# Patient Record
Sex: Male | Born: 1995 | Race: Black or African American | Hispanic: No | Marital: Single | Smoking: Never smoker
Health system: Southern US, Community
[De-identification: ages and names within clinical notes are randomized; demographics above are authoritative.]

## PROBLEM LIST (undated history)

## (undated) DIAGNOSIS — F32A Depression, unspecified: Secondary | ICD-10-CM

## (undated) DIAGNOSIS — F329 Major depressive disorder, single episode, unspecified: Secondary | ICD-10-CM

## (undated) DIAGNOSIS — F909 Attention-deficit hyperactivity disorder, unspecified type: Secondary | ICD-10-CM

## (undated) DIAGNOSIS — D573 Sickle-cell trait: Secondary | ICD-10-CM

---
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
## 2019-03-07 ENCOUNTER — Emergency Department (HOSPITAL_COMMUNITY)
Admission: EM | Admit: 2019-03-07 | Discharge: 2019-03-07 | Disposition: A | Discharge status: Home / Self Care | Payer: Self-pay | Attending: Emergency Medicine | Admitting: Emergency Medicine

## 2019-03-07 ENCOUNTER — Emergency Department (HOSPITAL_COMMUNITY)
Admission: EM | Admit: 2019-03-07 | Discharge: 2019-03-07 | Discharge status: Absent without leave | Payer: Self-pay | Attending: Emergency Medicine | Admitting: Emergency Medicine

## 2019-03-07 ENCOUNTER — Other Ambulatory Visit: Payer: Self-pay

## 2019-03-07 DIAGNOSIS — K047 Periapical abscess without sinus: Secondary | ICD-10-CM

## 2019-03-07 DIAGNOSIS — F909 Attention-deficit hyperactivity disorder, unspecified type: Secondary | ICD-10-CM | POA: Insufficient documentation

## 2019-03-07 DIAGNOSIS — Z79899 Other long term (current) drug therapy: Secondary | ICD-10-CM | POA: Insufficient documentation

## 2019-03-07 DIAGNOSIS — K029 Dental caries, unspecified: Secondary | ICD-10-CM

## 2019-03-07 MED ORDER — PENICILLIN V POTASSIUM 500 MG PO TABS: 1000.0000 mg | ORAL_TABLET | Freq: Two times a day (BID) | ORAL | 0 refills | Status: DC

## 2019-03-07 NOTE — ED Provider Notes (Addendum)
[facility]   CSN: [id number] Arrival date & time: 03/07/19  1401    History   Chief Complaint Chief Complaint  Patient presents with   Dental Pain    HPI    Mitchell Murphy is a 23 y.o. otherwise healthy male who presents to the ED with complaints of right upper dental pain that began about 3 days ago.  Patient states that there is a tooth that is broken and he believes that it is causing his discomfort.  He describes the pain as 10/10 constant throbbing nonradiating right upper dental pain that worsens with cold air exposure and has been somewhat improved with Tylenol, benzocaine, and peroxide.  He denies any gum swelling or drainage, drooling, trismus, fevers, chills, or any other complaints at this time.  He is a non-smoker.  He does not currently have a dentist.    No past medical history on file.  Patient Active Problem List   Diagnosis Date Noted   BMI 25.0-25.9,adult 05/30/2017    Past Surgical History:  Procedure Laterality Date   NO PAST SURGERIES          Home Medications    Prior to Admission medications   Medication Sig Start Date End Date Taking? Authorizing Provider  amoxicillin (AMOXIL) 875 MG tablet Take 1 tablet (875 mg total) by mouth 2 (two) times daily. 06/02/17   Porfirio OarJeffery, Chelle, PA  azelastine (ASTELIN) 0.1 % nasal spray Place 2 sprays into both nostrils 2 (two) times daily. Use in each nostril as directed 05/30/17   Porfirio OarJeffery, Chelle, PA  Guaifenesin (MUCINEX MAXIMUM STRENGTH) 1200 MG TB12 Take 1 tablet (1,200 mg total) by mouth every 12 (twelve) hours as needed. 05/30/17   Porfirio OarJeffery, Chelle, PA  penicillin v potassium (VEETID) 500 MG tablet Take 2 tablets (1,000 mg total) by mouth 2 (two) times daily. X 7 days 03/07/19   Street, FedoraMercedes, PA-C    Family History Family History  Problem Relation Age of Onset   Hyperlipidemia Father        managed with lifestyle   Cancer Maternal Grandfather     Diabetes Paternal Grandfather     Social History Social History   Tobacco Use   Smoking status: Never Smoker   Smokeless tobacco: Never Used  Substance Use Topics   Alcohol use: No   Drug use: No     Allergies   Patient has no known allergies.   Review of Systems Review of Systems  Constitutional: Negative for chills and fever.  HENT: Positive for dental problem. Negative for drooling, facial swelling and trouble swallowing.   Allergic/Immunologic: Negative for immunocompromised state.     Physical Exam Updated Vital Signs BP 132/78   Pulse 78   Temp 98 F (36.7 C) (Oral)   Resp 18   SpO2 99%   Physical Exam Vitals signs and nursing note reviewed.  Constitutional:      General: He is not in acute distress.    Appearance: Normal appearance. He is well-developed. He is not toxic-appearing.     Comments: Afebrile, nontoxic, NAD  HENT:     Head: Normocephalic and atraumatic.     Nose: Nose normal.     Mouth/Throat:     Mouth: Mucous membranes are moist.     Dentition: Dental tenderness and dental caries present. No dental abscesses.     Pharynx: Oropharynx is clear. Uvula midline. No pharyngeal swelling, oropharyngeal exudate, posterior oropharyngeal erythema or uvula swelling.  Tonsils: No tonsillar exudate or tonsillar abscesses.      Comments: Nose clear.  R upper tooth #2 partially decayed and with caries with mild TTP, with no surrounding gingival swelling and erythema, no definite abscess, no evidence of ludwig's.  Oropharynx clear and moist, without uvular swelling or deviation, no trismus or drooling, no tonsillar swelling or erythema, no exudates.    Eyes:     General:        Right eye: No discharge.        Left eye: No discharge.     Conjunctiva/sclera: Conjunctivae normal.  Neck:     Musculoskeletal: Normal range of motion and neck supple.  Cardiovascular:     Rate and Rhythm: Normal rate.     Pulses: Normal pulses.  Pulmonary:     Effort:  Pulmonary effort is normal. No respiratory distress.  Abdominal:     General: There is no distension.  Musculoskeletal: Normal range of motion.  Skin:    General: Skin is warm and dry.     Findings: No rash.  Neurological:     Mental Status: He is alert and oriented to person, place, and time.     Sensory: Sensation is intact. No sensory deficit.     Motor: Motor function is intact.  Psychiatric:        Mood and Affect: Mood and affect normal.        Behavior: Behavior normal.      ED Treatments / Results  Labs (all labs ordered are listed, but only abnormal results are displayed) Labs Reviewed - No data to display  EKG None  Radiology No results found.  Procedures Procedures (including critical care time)  Medications Ordered in ED Medications - No data to display   Initial Impression / Assessment and Plan / ED Course  I have reviewed the triage vital signs and the nursing notes.  Pertinent labs & imaging results that were available during my care of the patient were reviewed by me and considered in my medical decision making (see chart for details).        23 y.o. male here with Dental pain associated with dental decay and possible dental infection but no definite abscess, with patient afebrile, non toxic appearing and swallowing secretions well, no evidence of ludwig's. I gave patient referral to dentist/resource guide of dentists and stressed the importance of dental follow up for ultimate management of dental pain.  I have also discussed reasons to return immediately to the ER.  Patient expresses understanding and agrees with plan.  I will also give PCN VK and discussed OTC remedies for pain control.    Final Clinical Impressions(s) / ED Diagnoses   Final diagnoses:  Dental decay  Pain due to dental caries  Infected dental caries    ED Discharge Orders          Ordered    penicillin v potassium (VEETID) 500 MG tablet  2 times daily     03/07/19 [address], [place], [place] 03/07/19 1423    Tilden Fossa, MD 03/07/19 1622

## 2019-03-07 NOTE — ED Provider Notes (Addendum)
[facility]   CSN: [id number] Arrival date & time: 03/07/19  1524    History   Chief Complaint No chief complaint on file.   HPI    Mitchell Murphy is a 23 y.o. male who presents to the ED with complaints of right upper dental pain that began about 3 days ago.  Patient states that there is a tooth that is broken and he believes that it is causing his discomfort.  He describes the pain as 10/10 constant throbbing nonradiating right upper dental pain that worsens with cold air exposure and has been somewhat improved with Tylenol, benzocaine, and peroxide.  He denies any gum swelling or drainage, drooling, trismus, fevers, chills, or any other complaints at this time.  He is a non-smoker.  He does not currently have a dentist.  PLEASE NOTE, THIS IS COPIED FROM THE CHART WHERE HE WAS REGISTERED IN ERROR UNDER THE WRONG NAME.   The history is provided by the patient and medical records. No language interpreter was used.    Past Medical History:  Diagnosis Date  . ADHD (attention deficit hyperactivity disorder)   . Depression   . Sickle cell trait     There are no active problems to display for this patient.   No past surgical history on file.      Home Medications    Prior to Admission medications   Medication Sig Start Date End Date Taking? Authorizing Provider  albuterol (PROVENTIL HFA;VENTOLIN HFA) 108 (90 BASE) MCG/ACT inhaler Inhale 2 puffs into the lungs every 6 (six) hours as needed. For shortness of breath or wheezing    [provider]  doxycycline (VIBRAMYCIN) 100 MG capsule Take 1 capsule (100 mg total) by mouth 2 (two) times daily. 05/24/13   Lattie HawBeese, Stephen A, MD  ferrous sulfate 325 (65 FE) MG tablet Take 325 mg by mouth daily.    [provider]  ketoconazole (NIZORAL) 2 % cream Apply topically daily. 05/24/13   Lattie HawBeese, Stephen A, MD    Family History Family History  Problem Relation Age of  Onset  . Sickle cell trait Father     Social History Social History   Tobacco Use  . Smoking status: Never Smoker  . Smokeless tobacco: Never Used  Substance Use Topics  . Alcohol use: No  . Drug use: No     Allergies   Patient has no known allergies.   Review of Systems Review of Systems  Constitutional: Negative for chills and fever.  HENT: Positive for dental problem. Negative for drooling, facial swelling and trouble swallowing.   Allergic/Immunologic: Negative for immunocompromised state.     Physical Exam Updated Vital Signs BP 132/78   Pulse 78   Temp 98 F (36.7 C) (Oral)   Resp 18   SpO2 99% (COPIED FROM CHART WHERE HE WAS REGISTERED IN ERROR)  Physical Exam Vitals signs and nursing note reviewed.  Constitutional:      General: He is not in acute distress.    Appearance: Normal appearance. He is well-developed. He is not toxic-appearing.     Comments: Afebrile, nontoxic, NAD  HENT:     Head: Normocephalic and atraumatic.     Nose: Nose normal.     Mouth/Throat:     Mouth: Mucous membranes are moist.     Dentition: Dental tenderness and dental caries present. No dental abscesses.     Pharynx: Oropharynx is clear. Uvula midline. No pharyngeal swelling, oropharyngeal exudate, posterior  oropharyngeal erythema or uvula swelling.     Tonsils: No tonsillar exudate or tonsillar abscesses.      Comments: Nose clear.  R upper tooth #2 partially decayed and with caries with mild TTP, with no surrounding gingival swelling and erythema, no definite abscess, no evidence of ludwig's.  Oropharynx clear and moist, without uvular swelling or deviation, no trismus or drooling, no tonsillar swelling or erythema, no exudates.  Eyes:     General:        Right eye: No discharge.        Left eye: No discharge.     Conjunctiva/sclera: Conjunctivae normal.  Neck:     Musculoskeletal: Normal range of motion and neck supple.  Cardiovascular:     Rate and Rhythm: Normal  rate.     Pulses: Normal pulses.  Pulmonary:     Effort: Pulmonary effort is normal. No respiratory distress.  Abdominal:     General: There is no distension.  Musculoskeletal: Normal range of motion.  Skin:    General: Skin is warm and dry.     Findings: No rash.  Neurological:     Mental Status: He is alert and oriented to person, place, and time.     Sensory: Sensation is intact. No sensory deficit.     Motor: Motor function is intact.  Psychiatric:        Mood and Affect: Mood and affect normal.        Behavior: Behavior normal.      ED Treatments / Results  Labs (all labs ordered are listed, but only abnormal results are displayed) Labs Reviewed - No data to display  EKG None  Radiology No results found.  Procedures Procedures (including critical care time)  Medications Ordered in ED Medications - No data to display   Initial Impression / Assessment and Plan / ED Course  I have reviewed the triage vital signs and the nursing notes.  Pertinent labs & imaging results that were available during my care of the patient were reviewed by me and considered in my medical decision making (see chart for details).        23 y.o. male here with Dental pain associated with dental decay and possible dental infection but no definite abscess, with patient afebrile, non toxic appearing and swallowing secretions well, no evidence of ludwig's. I gave patient referral to dentist/resource guide of dentists and stressed the importance of dental follow up for ultimate management of dental pain.  I have also discussed reasons to return immediately to the ER.  Patient expresses understanding and agrees with plan.  I will also give PCN VK and discussed OTC remedies for pain control.   PLEASE NOTE, THIS IS COPIED FROM THE CHART WHERE HE WAS REGISTERED IN ERROR UNDER THE WRONG NAME.    Final Clinical Impressions(s) / ED Diagnoses   Final diagnoses:  Dental decay  Pain due to dental  caries  Infected dental caries    ED Discharge Orders         Ordered    penicillin v potassium (VEETID) 500 MG tablet  2 times daily     03/07/19 [address] [place], [place] 03/07/19 1557    Tilden Fossa, MD 03/07/19 [phone]

## 2019-03-07 NOTE — ED Notes (Addendum)
PT RETURNED AND STATES HE WAS REGISTERED UNDER THE WRONG NAME.  BIRTHDATES MATCHED BUT NAME WAS COMPLETELY DIFFERENT.  REGISTRATION NOTIFIED AND PA NOTIFIED.  BELOW IS DOCUMENTATION FROM PREVIOUS VISIT AS THAT RN IS NO LONGER HERE TO COMPLETE UNDER CORRECT CHART.      14:14 HEENT Assessment SANCHEZ, ALEXA AS     Details:  HEENT - HEENT (WDL): Exceptions to WDL  Teeth:  (right upper tooth pain ; no drainage noted)     14:14 Hourly Rounding SANCHEZ, ALEXA AS     Details:  Hourly Rounding - Assessment: Alert; Patient comfortable  Intervention: Call light w/in reach; Environment secured; Introduced self to pt/other; Pain assessed; Patient repositioned; Stretcher locked in lowest position; Plan of care discussed with pt/other      14:14 Triage Completed SANCHEZ, ALEXA AS          14:13 Allergies Reviewed - Review Complete SANCHEZ, ALEXA AS     Details:      14:13 Fall Risk Assessment SANCHEZ, ALEXA AS     Details:  Fall Risk Assessment Tool: - History of fall in last 3 months:: No  Confusion or Disorientation:: No  Intoxicated or Sedated:: No  Impaired Gait:: No  Mobility Assist Device Used:: No  Altered Elimination: No  Fall Risk Score:: 0  Adult Fall Risk - Patient Fall Risk Level: Low fall risk  Adult Fall Risk Interventions by Location - Fall risk intervention based on location: ED Patient  Adult Fall Risk Interventions by Location - Required Bundle Interventions *See Row Information*: Low risk bundle implemented      14:13 Violence Risk Assess/Score SANCHEZ, ALEXA AS     Details:  Presenting Behavior - History of Violence: No  Uncooperative: No  Verbal Abuse: No  Hostile/Attacking Objects: No  Threats: No  Assaultive/Combative: No  Total Violence/Safety Score: 0  Violence Risk - Violence Risk: Low/Universal      14:13 Weapons Assessment SANCHEZ, ALEXA AS     Details:  Weapons Assessment - Weapons assessment this visit:: None      14:13 Attempt Initial Suicide Assessment SANCHEZ,  ALEXA AS     Details:  Risk for Self-Harm - Is the patient at risk for self-harm, presenting with behavioral health concerns, or has history of behavioral services?: No      14:13 Other Flowsheet Documentation SANCHEZ, ALEXA AS     Details:  Other flowsheet entries - Patient Acuity: 4  Triage Complete: Triage complete      14:12 Travel Screening SANCHEZ, ALEXA AS     Details:  In the last month, have you been in contact with someone who was confirmed or suspected to have Coronavirus / COVID-19? No / Unsure Do you have any of the following symptoms? None of these Have you traveled internationally in the last month? No     14:12 Primary Assessment SANCHEZ, ALEXA AS     Details:  Airway - Airway (WDL): Within Defined Limits  Breathing - Breathing (WDL): Within Defined Limits  Circulation - Circulation (WDL): Within Defined Limits  Disability - Disability (WDL): Within Defined Limits      14:12 Pain Assessment SANCHEZ, ALEXA AS     Details:  Pain Assessment - Pain Scale: 0-10  Pain Score: 7   Pain Type: Acute pain  Pain Location: Teeth  Pain Orientation: Right  Pain Descriptors / Indicators: Aching      14:12 Vitals SANCHEZ, ALEXA AS     Details:  Vital Signs - Temp: 98 F (36.7 C)  Temp Source: Oral  Pulse Rate: 78  Resp: 18  BP: 132/78  Oxygen Therapy - SpO2: 99 %  O2 Device: Room Air  Vitals Assessment - Automatic Restart Vitals Timer: Yes      14:11 ED Triage Notes Filed SANCHEZ, ALEXA AS     Details:  Pt c/o right upper tooth pain ; denies any drainage or trouble breathing ; denies any fever      14:11 Chief Complaints Updated SANCHEZ, ALEXA AS     Details:  + Dental Pain      14:11 Triage Started SANCHEZ, ALEXA AS          14:10 Other Flowsheet Documentation SANCHEZ, ALEXA AS     Details:  Other flowsheet entries - Means of Arrival: POV  Triage Start: Start  Did the patient arrive from a Nursing Home or Assisted Living Facility?: No      14:06 Vitals Reginold Agent  MW     Details:  Vital Signs - BP: 132/78      14:06 Other Flowsheet Documentation Reginold Agent MW     Details:  Other flowsheet entries - Means of Arrival: POV      14:03 Assign Mid-level STREET, [place]     Details: Street, [place], PA-C assigned as Physician Assistant     14:03 Assign Attending STREET, [place]     Details: Tilden Fossa, MD assigned as Attending     14:03 Patient roomed in ED Thomasene Ripple KN     Details: To room 008C     14:02 Arrival Complaint       Details:  toothache      14:02 Travel Screening QUICK, KIMBERLY A KQ     Details:  Have you traveled internationally in the last month? No

## 2019-03-07 NOTE — Discharge Instructions (Signed)
Apply warm or cool compresses to jaw throughout the day. Take antibiotic until finished. Alternate between tylenol and motrin as needed for pain. Perform salt water swishes to help with pain/swelling. Use over the counter oragel/benzocaine as needed for additional relief. Followup with a dentist is very important for ongoing evaluation and management of recurrent dental pain, call the dentist listed above in the next 24-48 hours to schedule ongoing dental care, or use the list below to find a dentist in the next 24-48 hours for ongoing management of your dental issue. Return to emergency department for emergent changing or worsening symptoms.  °

## 2019-03-07 NOTE — ED Notes (Addendum)
Pt complaint of right upper tooth pain, denies any drainage or fever. Pt was recently seen here for complaint. Arrived again due to being registered under the wrong name previously.

## 2019-03-07 NOTE — Discharge Instructions (Addendum)
Apply warm or cool compresses to jaw throughout the day. Take antibiotic until finished. Alternate between tylenol and motrin as needed for pain. Perform salt water swishes to help with pain/swelling. Use over the counter oragel/benzocaine as needed for additional relief. Followup with a dentist is very important for ongoing evaluation and management of recurrent dental pain, call the dentist listed above in the next 24-48 hours to schedule ongoing dental care, or use the list below to find a dentist in the next 24-48 hours for ongoing management of your dental issue. Return to emergency department for emergent changing or worsening symptoms.

## 2019-03-07 NOTE — ED Triage Notes (Signed)
Pt c/o right upper tooth pain ; denies any drainage or trouble breathing ; denies any fever

## 2019-08-23 ENCOUNTER — Encounter (HOSPITAL_COMMUNITY): Payer: Self-pay | Admitting: Emergency Medicine

## 2019-08-23 ENCOUNTER — Emergency Department (HOSPITAL_COMMUNITY)
Admission: EM | Admit: 2019-08-23 | Discharge: 2019-08-24 | Discharge status: Against Medical Advice | Payer: Self-pay | Attending: Emergency Medicine | Admitting: Emergency Medicine

## 2019-08-23 ENCOUNTER — Other Ambulatory Visit: Payer: Self-pay

## 2019-08-23 DIAGNOSIS — Z5321 Procedure and treatment not carried out due to patient leaving prior to being seen by health care provider: Secondary | ICD-10-CM | POA: Insufficient documentation

## 2019-08-23 LAB — COMPREHENSIVE METABOLIC PANEL
ALT: 29 U/L (ref 0–44)
AST: 32 U/L (ref 15–41)
Albumin: 4.7 g/dL (ref 3.5–5.0)
Alkaline Phosphatase: 58 U/L (ref 38–126)
Anion gap: 12 (ref 5–15)
BUN: 16 mg/dL (ref 6–20)
CO2: 21 mmol/L — ABNORMAL LOW (ref 22–32)
Calcium: 9.8 mg/dL (ref 8.9–10.3)
Chloride: 103 mmol/L (ref 98–111)
Creatinine, Ser: 1.23 mg/dL (ref 0.61–1.24)
GFR calc Af Amer: >60 mL/min (ref >60)
GFR calc non Af Amer: >60 mL/min (ref >60)
Glucose, Bld: 98 mg/dL (ref 70–99)
Potassium: 4.4 mmol/L (ref 3.5–5.1)
Sodium: 136 mmol/L (ref 135–145)
Total Bilirubin: 0.6 mg/dL (ref 0.3–1.2)
Total Protein: 7.7 g/dL (ref 6.5–8.1)

## 2019-08-23 LAB — CBC
HCT: 46.6 % (ref 39.0–52.0)
Hemoglobin: 15.4 g/dL (ref 13.0–17.0)
MCH: 26.8 pg (ref 26.0–34.0)
MCHC: 33 g/dL (ref 30.0–36.0)
MCV: 81 fL (ref 80.0–100.0)
Platelets: 284 10*3/uL (ref 150–400)
RBC: 5.75 MIL/uL (ref 4.22–5.81)
RDW: 13.2 % (ref 11.5–15.5)
WBC: 8.7 10*3/uL (ref 4.0–10.5)
nRBC: 0 % (ref 0.0–0.2)

## 2019-08-23 LAB — ETHANOL: Alcohol, Ethyl (B): <10 mg/dL (ref <10)

## 2019-08-23 LAB — RAPID URINE DRUG SCREEN, HOSP PERFORMED
Amphetamines: NOT DETECTED
Barbiturates: NOT DETECTED
Benzodiazepines: NOT DETECTED
Cocaine: NOT DETECTED
Opiates: NOT DETECTED
Tetrahydrocannabinol: NOT DETECTED

## 2019-08-23 LAB — ACETAMINOPHEN LEVEL: Acetaminophen (Tylenol), Serum: <10 ug/mL — ABNORMAL LOW (ref 10–30)

## 2019-08-23 LAB — SALICYLATE LEVEL: Salicylate Lvl: <7 mg/dL (ref 2.8–30.0)

## 2019-08-23 NOTE — ED Triage Notes (Signed)
Patient here from home with complaints of anxiety and "feeling overwhelmed". Reports that he has been putting off seeking help for over 3 months.

## 2019-09-20 ENCOUNTER — Encounter (HOSPITAL_BASED_OUTPATIENT_CLINIC_OR_DEPARTMENT_OTHER): Payer: Self-pay | Admitting: Emergency Medicine

## 2019-09-20 ENCOUNTER — Other Ambulatory Visit: Payer: Self-pay

## 2019-09-20 ENCOUNTER — Emergency Department (HOSPITAL_BASED_OUTPATIENT_CLINIC_OR_DEPARTMENT_OTHER)
Admission: EM | Admit: 2019-09-20 | Discharge: 2019-09-21 | Disposition: A | Discharge status: Home / Self Care | Payer: Self-pay | Attending: Emergency Medicine | Admitting: Emergency Medicine

## 2019-09-20 DIAGNOSIS — R509 Fever, unspecified: Secondary | ICD-10-CM | POA: Insufficient documentation

## 2019-09-20 DIAGNOSIS — Z79899 Other long term (current) drug therapy: Secondary | ICD-10-CM | POA: Insufficient documentation

## 2019-09-20 DIAGNOSIS — Z20828 Contact with and (suspected) exposure to other viral communicable diseases: Secondary | ICD-10-CM | POA: Insufficient documentation

## 2019-09-20 NOTE — ED Triage Notes (Signed)
Fever today at 1700. Temp 101 at home took tylenol at 2100 tonight. No cough, or sob. C/O sore throat and headache.

## 2019-09-21 LAB — INFLUENZA PANEL BY PCR (TYPE A & B)
Influenza A By PCR: NEGATIVE
Influenza B By PCR: NEGATIVE

## 2019-09-21 LAB — GROUP A STREP BY PCR: Group A Strep by PCR: NOT DETECTED

## 2019-09-21 LAB — SARS CORONAVIRUS 2 (TAT 6-24 HRS): SARS Coronavirus 2: NEGATIVE

## 2019-09-21 MED ORDER — ONDANSETRON 4 MG PO TBDP: ORAL_TABLET | ORAL | 0 refills | Status: DC

## 2019-09-21 NOTE — ED Provider Notes (Signed)
[facility] EMERGENCY DEPARTMENT Provider Note   CSN: [id number] Arrival date & time: 09/20/19  2332     History   Chief Complaint Chief Complaint  Patient presents with  . Fever    HPI Mitchell Murphy is a 23 y.o. male.      Fever Max temp prior to arrival:  101 Temp source:  Oral Severity:  Moderate Onset quality:  Gradual Duration:  2 days Timing:  Constant Progression:  Worsening Chronicity:  New Relieved by:  Acetaminophen Ineffective treatments:  None tried Associated symptoms: congestion, headaches, myalgias, nausea, rhinorrhea and sore throat   Associated symptoms: no chest pain and no cough     Past Medical History:  Diagnosis Date  . ADHD (attention deficit hyperactivity disorder)   . Depression   . Sickle cell trait ([place])     There are no active problems to display for this patient.   History reviewed. No pertinent surgical history.      Home Medications    Prior to Admission medications   Medication Sig Start Date End Date Taking? Authorizing Provider  albuterol (PROVENTIL HFA;VENTOLIN HFA) 108 (90 BASE) MCG/ACT inhaler Inhale 2 puffs into the lungs every 6 (six) hours as needed. For shortness of breath or wheezing    [provider]  doxycycline (VIBRAMYCIN) 100 MG capsule Take 1 capsule (100 mg total) by mouth 2 (two) times daily. 05/24/13   Kandra Nicolas, MD  ferrous sulfate 325 (65 FE) MG tablet Take 325 mg by mouth daily.    [provider]  ketoconazole (NIZORAL) 2 % cream Apply topically daily. 05/24/13   Kandra Nicolas, MD  ondansetron (ZOFRAN ODT) 4 MG disintegrating tablet 4mg  ODT q4 hours prn nausea/vomit 09/21/19   Nadelyn Enriques, Corene Cornea, MD  penicillin v potassium (VEETID) 500 MG tablet Take 2 tablets (1,000 mg total) by mouth 2 (two) times daily. X 7 days 03/07/19   Street, [place], PA-C    Family History Family History  Problem Relation Age of Onset  . Sickle cell trait Father     Social History  Social History   Tobacco Use  . Smoking status: Never Smoker  . Smokeless tobacco: Never Used  Substance Use Topics  . Alcohol use: No  . Drug use: No     Allergies   Patient has no known allergies.   Review of Systems Review of Systems  Constitutional: Positive for fever.  HENT: Positive for congestion, rhinorrhea and sore throat.   Respiratory: Negative for cough.   Cardiovascular: Negative for chest pain.  Gastrointestinal: Positive for nausea.  Musculoskeletal: Positive for myalgias.  Neurological: Positive for headaches.  All other systems reviewed and are negative.    Physical Exam Updated Vital Signs BP 119/78 (BP Location: Right Arm)   Pulse 87   Temp 99.5 F (37.5 C) (Oral)   Resp 20   Ht 5\' 11"  (1.803 m)   Wt 102.1 kg   SpO2 100%   BMI 31.38 kg/m   Physical Exam Vitals signs and nursing note reviewed.  Constitutional:      Appearance: He is well-developed.  HENT:     Head: Normocephalic and atraumatic.     Nose: No congestion or rhinorrhea.     Mouth/Throat:     Mouth: Mucous membranes are moist.  Eyes:     Pupils: Pupils are equal, round, and reactive to light.  Neck:     Musculoskeletal: Normal range of motion.  Cardiovascular:     Rate and Rhythm:  Normal rate.  Pulmonary:     Effort: Pulmonary effort is normal. No respiratory distress.  Abdominal:     General: There is no distension.  Musculoskeletal: Normal range of motion.  Skin:    General: Skin is warm and dry.  Neurological:     General: No focal deficit present.     Mental Status: He is alert.      ED Treatments / Results  Labs (all labs ordered are listed, but only abnormal results are displayed) Labs Reviewed  GROUP A STREP BY PCR  SARS CORONAVIRUS 2 (TAT 6-24 HRS)  INFLUENZA PANEL BY PCR (TYPE A & B)    EKG None  Radiology No results found.  Procedures Procedures (including critical care time)  Medications Ordered in ED Medications - No data to display    Initial Impression / Assessment and Plan / ED Course  I have reviewed the triage vital signs and the nursing notes.  Pertinent labs & imaging results that were available during my care of the patient were reviewed by me and considered in my medical decision making (see chart for details).  Considered meningitis however patient does not have nuchal rigidity, high fever, vision changes, altered mental status or other neurologic changes to support this.  Seems to be more likely Covid.  Also considered influenza and will check for that as well.  Patient without any respiratory distress and relatively normal vital signs status post Tylenol.  Plan for supportive care at home.  Mitchell Murphy was evaluated in Emergency Department on 09/21/2019 for the symptoms described in the history of present illness. He was evaluated in the context of the global COVID-19 pandemic, which necessitated consideration that the patient might be at risk for infection with the SARS-CoV-2 virus that causes COVID-19. Institutional protocols and algorithms that pertain to the evaluation of patients at risk for COVID-19 are in a state of rapid change based on information released by regulatory bodies including the CDC and federal and state organizations. These policies and algorithms were followed during the patient's care in the ED.   Final Clinical Impressions(s) / ED Diagnoses   Final diagnoses:  Febrile illness    ED Discharge Orders         Ordered    ondansetron (ZOFRAN ODT) 4 MG disintegrating tablet     09/21/19 0055           Ruthanna Macchia, Barbara Cower, MD 09/21/19 [id number]

## 2020-07-07 ENCOUNTER — Other Ambulatory Visit: Payer: Self-pay

## 2020-07-07 ENCOUNTER — Emergency Department (HOSPITAL_BASED_OUTPATIENT_CLINIC_OR_DEPARTMENT_OTHER): Admission: EM | Admit: 2020-07-07 | Discharge: 2020-07-07 | Discharge status: Against Medical Advice | Payer: Self-pay

## 2020-11-20 ENCOUNTER — Encounter (HOSPITAL_BASED_OUTPATIENT_CLINIC_OR_DEPARTMENT_OTHER): Payer: Self-pay | Admitting: *Deleted

## 2020-11-20 ENCOUNTER — Other Ambulatory Visit: Payer: Self-pay

## 2020-11-20 ENCOUNTER — Emergency Department (HOSPITAL_BASED_OUTPATIENT_CLINIC_OR_DEPARTMENT_OTHER): Payer: Self-pay

## 2020-11-20 ENCOUNTER — Emergency Department (HOSPITAL_BASED_OUTPATIENT_CLINIC_OR_DEPARTMENT_OTHER)
Admission: EM | Admit: 2020-11-20 | Discharge: 2020-11-20 | Disposition: A | Discharge status: Home / Self Care | Payer: Self-pay | Attending: Emergency Medicine | Admitting: Emergency Medicine

## 2020-11-20 DIAGNOSIS — S46912A Strain of unspecified muscle, fascia and tendon at shoulder and upper arm level, left arm, initial encounter: Secondary | ICD-10-CM | POA: Insufficient documentation

## 2020-11-20 DIAGNOSIS — Y9351 Activity, roller skating (inline) and skateboarding: Secondary | ICD-10-CM | POA: Insufficient documentation

## 2020-11-20 MED ORDER — DICLOFENAC SODIUM 50 MG PO TBEC: 50.0000 mg | DELAYED_RELEASE_TABLET | Freq: Two times a day (BID) | ORAL | 0 refills | Status: AC

## 2020-11-20 MED ORDER — ORPHENADRINE CITRATE ER 100 MG PO TB12: 100.0000 mg | ORAL_TABLET | Freq: Two times a day (BID) | ORAL | 0 refills | Status: DC

## 2020-11-20 NOTE — ED Provider Notes (Signed)
[facility] EMERGENCY DEPARTMENT Provider Note   CSN: [id number] Arrival date & time: 11/20/20  1202     History Chief Complaint  Patient presents with  . Fall  . Shoulder Injury    Mitchell Murphy is a 24 y.o. male.  24 year old male presents with complaint of left shoulder injury.  Patient states that he was riding a skateboard yesterday when he fell off her skateboard landing on his left shoulder and hit the left side of his head on the pavement.  Patient did not lose consciousness, is not anticoagulated, denies complaints of headache today.  Patient's primary complaint is left shoulder pain through his AC area and worse with movement of the left shoulder.  Patient has been taking Aleve for his pain with some relief.  No other injuries, complaints, concerns.        Past Medical History:  Diagnosis Date  . ADHD (attention deficit hyperactivity disorder)   . Depression   . Sickle cell trait ([facility])     There are no problems to display for this patient.   History reviewed. No pertinent surgical history.     Family History  Problem Relation Age of Onset  . Sickle cell trait Father     Social History   Tobacco Use  . Smoking status: Never Smoker  . Smokeless tobacco: Never Used  Substance Use Topics  . Alcohol use: No  . Drug use: No    Home Medications Prior to Admission medications   Medication Sig Start Date End Date Taking? Authorizing Provider  diclofenac (VOLTAREN) 50 MG EC tablet Take 1 tablet (50 mg total) by mouth 2 (two) times daily for 10 days. 11/20/20 11/30/20 Yes Jeannie Fend, PA-C  orphenadrine (NORFLEX) 100 MG tablet Take 1 tablet (100 mg total) by mouth 2 (two) times daily. 11/20/20  Yes Jeannie Fend, PA-C  albuterol (PROVENTIL HFA;VENTOLIN HFA) 108 (90 BASE) MCG/ACT inhaler Inhale 2 puffs into the lungs every 6 (six) hours as needed. For shortness of breath or wheezing    [provider]  doxycycline (VIBRAMYCIN) 100 MG  capsule Take 1 capsule (100 mg total) by mouth 2 (two) times daily. 05/24/13   Lattie Haw, MD  ferrous sulfate 325 (65 FE) MG tablet Take 325 mg by mouth daily.    [provider]  ketoconazole (NIZORAL) 2 % cream Apply topically daily. 05/24/13   Lattie Haw, MD  ondansetron (ZOFRAN ODT) 4 MG disintegrating tablet 4mg  ODT q4 hours prn nausea/vomit 09/21/19   Mesner, 09/23/19, MD  penicillin v potassium (VEETID) 500 MG tablet Take 2 tablets (1,000 mg total) by mouth 2 (two) times daily. X 7 days 03/07/19   Street, [place], [place]    Allergies    Patient has no known allergies.  Review of Systems   Review of Systems  Constitutional: Negative for fever.  Musculoskeletal: Positive for arthralgias and myalgias. Negative for back pain, gait problem and neck pain.  Skin: Negative for rash and wound.  Allergic/Immunologic: Negative for immunocompromised state.  Neurological: Negative for dizziness, weakness, numbness and headaches.  Hematological: Does not bruise/bleed easily.  Psychiatric/Behavioral: Negative for confusion.    Physical Exam Updated Vital Signs BP 117/70 (BP Location: Right Arm)   Pulse 62   Temp 97.9 F (36.6 C) (Oral)   Resp 16   Ht 5\' 11"  (1.803 m)   Wt 118.2 kg   SpO2 100%   BMI 36.34 kg/m   Physical Exam Vitals and nursing note reviewed.  Constitutional:      General: He is not in acute distress.    Appearance: He is well-developed and well-nourished. He is not diaphoretic.  HENT:     Head: Normocephalic and atraumatic.  Cardiovascular:     Pulses: Normal pulses.  Pulmonary:     Effort: Pulmonary effort is normal.  Musculoskeletal:        General: Tenderness present. No swelling or deformity.       Arms:     Cervical back: Normal range of motion and neck supple. No tenderness or bony tenderness.       Back:     Comments: TTP left trapezius and left AC, worse with forward extension of the left arm/shoulder. No elbow or wrist pain.    Skin:    General: Skin is warm and dry.     Findings: No erythema or rash.  Neurological:     Mental Status: He is alert and oriented to person, place, and time.  Psychiatric:        Mood and Affect: Mood and affect normal.        Behavior: Behavior normal.     ED Results / Procedures / Treatments   Labs (all labs ordered are listed, but only abnormal results are displayed) Labs Reviewed - No data to display  EKG None  Radiology DG Shoulder Left  Result Date: 11/20/2020 CLINICAL DATA:  Fall.  Rule out fracture. EXAM: LEFT SHOULDER - 2+ VIEW COMPARISON:  None. FINDINGS: Osseous alignment is normal. No fracture line or displaced fracture fragment is seen. Soft tissues about the LEFT shoulder are unremarkable. IMPRESSION: Negative. Electronically Signed   By: Bary Richard M.D.   On: 11/20/2020 13:28    Procedures Procedures (including critical care time)  Medications Ordered in ED Medications - No data to display  ED Course  I have reviewed the triage vital signs and the nursing notes.  Pertinent labs & imaging results that were available during my care of the patient were reviewed by me and considered in my medical decision making (see chart for details).  Clinical Course as of 11/20/20 1942  Mon Nov 20, 2020  5078 24 year old male with complaint of left shoulder pain after fall from skateboard yesterday.  Has tenderness in left trapezius and left AC joint space, worse with forward extension of the left arm.  Skin is intact, no deformity, no swelling.  X-ray left shoulder is unremarkable. Recommend gentle range of motion exercises, warm compresses return instead time, given prescription for diclofenac and Norflex.  Advised to follow-up with sports medicine for further evaluation. [LM]    Clinical Course User Index [LM] Mitchell Murphy   MDM Rules/Calculators/A&P                          Final Clinical Impression(s) / ED Diagnoses Final diagnoses:  Strain of  left shoulder, initial encounter    Rx / DC Orders ED Discharge Orders         Ordered    diclofenac (VOLTAREN) 50 MG EC tablet  2 times daily        11/20/20 1830    orphenadrine (NORFLEX) 100 MG tablet  2 times daily        11/20/20 1830           Mitchell Murphy 11/20/20 1942    Terald Sleeper, MD 11/21/20 [phone]

## 2020-11-20 NOTE — ED Triage Notes (Signed)
He fell off a hover board yesterday. Injury to his left shoulder.

## 2020-11-20 NOTE — Discharge Instructions (Signed)
Follow-up with sports medicine, call tomorrow to schedule an appointment. Apply warm compresses for 20 to time followed by gentle stretching and range of motion exercises as discussed. Take diclofenac as needed as prescribed for pain.  This is an NSAID medication, do not take other NSAIDs such as Aleve, ibuprofen, Motrin or Advil. Take Norflex as needed as prescribed for pain not controlled with diclofenac, do not drive or operate machinery if taking Norflex.

## 2020-11-28 ENCOUNTER — Telehealth: Payer: Self-pay | Admitting: Family Medicine

## 2020-11-28 NOTE — Telephone Encounter (Signed)
Unsuccessful attempt to contact pt for ED follow-up appt--VMB full unable to leave message--cb later--glh

## 2021-06-18 ENCOUNTER — Other Ambulatory Visit: Payer: Self-pay

## 2021-06-18 ENCOUNTER — Ambulatory Visit
Admission: EM | Admit: 2021-06-18 | Discharge: 2021-06-18 | Disposition: A | Discharge status: Home / Self Care | Payer: Medicaid Other | Attending: Family Medicine | Admitting: Family Medicine

## 2021-06-18 DIAGNOSIS — R42 Dizziness and giddiness: Secondary | ICD-10-CM

## 2021-06-18 LAB — POCT URINALYSIS DIP (MANUAL ENTRY)
Bilirubin, UA: NEGATIVE
Blood, UA: NEGATIVE
Glucose, UA: NEGATIVE mg/dL
Ketones, POC UA: NEGATIVE mg/dL
Leukocytes, UA: NEGATIVE
Nitrite, UA: NEGATIVE
Protein Ur, POC: NEGATIVE mg/dL
Spec Grav, UA: 1.01 (ref 1.010–1.025)
Urobilinogen, UA: 0.2 E.U./dL
pH, UA: 7 (ref 5.0–8.0)

## 2021-06-18 MED ORDER — MECLIZINE HCL 25 MG PO TABS: 25.0000 mg | ORAL_TABLET | Freq: Three times a day (TID) | ORAL | 0 refills | Status: AC | PRN

## 2021-06-18 NOTE — ED Triage Notes (Signed)
Pt states at work today and developed dizziness around 1130. 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 States drinking water. States left work early took a nap and work up still dizzy.

## 2021-06-20 ENCOUNTER — Telehealth (HOSPITAL_COMMUNITY): Payer: Self-pay | Admitting: Emergency Medicine

## 2021-06-20 LAB — CBC WITH DIFFERENTIAL/PLATELET

## 2021-06-20 LAB — COMPREHENSIVE METABOLIC PANEL
ALT: 70 IU/L — ABNORMAL HIGH (ref 0–44)
AST: 56 IU/L — ABNORMAL HIGH (ref 0–40)
Albumin/Globulin Ratio: 2 (ref 1.2–2.2)
Albumin: 4.5 g/dL (ref 4.1–5.2)
Alkaline Phosphatase: 75 IU/L (ref 44–121)
BUN/Creatinine Ratio: 11 (ref 9–20)
BUN: 11 mg/dL (ref 6–20)
Bilirubin Total: 0.4 mg/dL (ref 0.0–1.2)
CO2: 23 mmol/L (ref 20–29)
Calcium: 9.3 mg/dL (ref 8.7–10.2)
Chloride: 105 mmol/L (ref 96–106)
Creatinine, Ser: 1.03 mg/dL (ref 0.76–1.27)
Globulin, Total: 2.3 g/dL (ref 1.5–4.5)
Glucose: 83 mg/dL (ref 65–99)
Potassium: 4.3 mmol/L (ref 3.5–5.2)
Sodium: 137 mmol/L (ref 134–144)
Total Protein: 6.8 g/dL (ref 6.0–8.5)
eGFR: 103 mL/min/{1.73_m2} (ref >59)

## 2021-06-20 NOTE — Telephone Encounter (Signed)
Per Dr. Leonides Grills, patient to return for recollect on CBC that was cancelled   Attempted to reach patient x 1, VM is not setup

## 2021-06-22 NOTE — ED Provider Notes (Signed)
[facility]    CSN: [id number] Arrival date & time: 06/18/21  1745      History   Chief Complaint Chief Complaint  Patient presents with   Dizziness    HPI Rajah Tagliaferro is a 25 y.o. male.   Presenting today with dizziness that started this afternoon at work. 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 Constant feeling of being "off balance" and notes worse when he tilts his head backward. Felt like he may faint at one point today at work but not since. Denies CP, SOB, palpitations, headache, dizziness, N/V, syncope, history of cardiac or neurologic conditions. Tried taking a nap and hydrating well with mild relief.   Past Medical History:  Diagnosis Date   ADHD (attention deficit hyperactivity disorder)    Depression    Sickle cell trait (HCC)     There are no problems to display for this patient.   History reviewed. No pertinent surgical history.     Home Medications    Prior to Admission medications   Medication Sig Start Date End Date Taking? Authorizing Provider  meclizine (ANTIVERT) 25 MG tablet Take 1 tablet (25 mg total) by mouth 3 (three) times daily as needed for dizziness. 06/18/21  Yes Particia Nearing, PA-C  albuterol (PROVENTIL HFA;VENTOLIN HFA) 108 (90 BASE) MCG/ACT inhaler Inhale 2 puffs into the lungs every 6 (six) hours as needed. For shortness of breath or wheezing    [provider]    Family History Family History  Problem Relation Age of Onset   Sickle cell trait Father     Social History Social History   Tobacco Use   Smoking status: Never   Smokeless tobacco: Never  Substance Use Topics   Alcohol use: No   Drug use: No     Allergies   Patient has no known allergies.   Review of Systems Review of Systems PER HPI   Physical Exam Triage Vital Signs ED Triage Vitals  Enc Vitals Group     BP 06/18/21 1941 136/77     Pulse Rate 06/18/21 1941 68     Resp  06/18/21 1941 18     Temp 06/18/21 1941 97.9 F (36.6 C)     Temp Source 06/18/21 1941 Oral     SpO2 --      Weight --      Height --      Head Circumference --      Peak Flow --      Pain Score 06/18/21 1943 5     Pain Loc --      Pain Edu? --      Excl. in GC? --    No data found.  Updated Vital Signs BP 136/77 (BP Location: Left Arm)   Pulse 68   Temp 97.9 F (36.6 C) (Oral)   Resp 18   Visual Acuity Right Eye Distance:   Left Eye Distance:   Bilateral Distance:    Right Eye Near:   Left Eye Near:    Bilateral Near:     Physical Exam Vitals and nursing note reviewed.  Constitutional:      Appearance: Normal appearance.  HENT:     Head: Atraumatic.     Mouth/Throat:     Mouth: Mucous membranes are moist.     Pharynx: Oropharynx is clear.  Eyes:     Extraocular Movements: Extraocular movements intact.     Conjunctiva/sclera: Conjunctivae  normal.  Cardiovascular:     Rate and Rhythm: Normal rate and regular rhythm.     Heart sounds: Normal heart sounds.  Pulmonary:     Effort: Pulmonary effort is normal. No respiratory distress.     Breath sounds: Normal breath sounds. No wheezing or rales.  Abdominal:     General: Bowel sounds are normal. There is no distension.     Palpations: Abdomen is soft.     Tenderness: There is no abdominal tenderness. There is no guarding.  Musculoskeletal:        General: No swelling. Normal range of motion.     Cervical back: Normal range of motion and neck supple.  Skin:    General: Skin is warm and dry.  Neurological:     General: No focal deficit present.     Mental Status: He is oriented to person, place, and time.     Sensory: No sensory deficit.     Motor: No weakness.     Gait: Gait normal.  Psychiatric:        Mood and Affect: Mood normal.        Thought Content: Thought content normal.        Judgment: Judgment normal.     UC Treatments / Results  Labs (all labs ordered are listed, but only abnormal  results are displayed) Labs Reviewed  COMPREHENSIVE METABOLIC PANEL - Abnormal; Notable for the following components:      Result Value   AST 56 (*)    ALT 70 (*)    All other components within normal limits   Narrative:    Performed at:  [address], [place], [place]  [id number] Lab Director: Jolene Schimke MD, Phone:  [phone]  CBC WITH DIFFERENTIAL/PLATELET   Narrative:    Performed at:  [address] [place] [address] [place], [place]  [id number] Lab Director: Jolene Schimke MD, Phone:  [phone]  POCT URINALYSIS DIP (MANUAL ENTRY)    EKG   Radiology No results found.  Procedures Procedures (including critical care time)  Medications Ordered in UC Medications - No data to display  Initial Impression / Assessment and Plan / UC Course  I have reviewed the triage vital signs and the nursing notes.  Pertinent labs & imaging results that were available during my care of the patient were reviewed by me and considered in my medical decision making (see chart for details).     Vitals and exam completely benign today, EKG normal sinus rhythm without acute ST or T wave changes, labs pending. Possibly sxs from overheating, patient also concerned about vertigo though non-classic presentation. Trial meclizine in case providing some benefit. Strict return precautions reviewed for worsening sxs. Work note given.   Final Clinical Impressions(s) / UC Diagnoses   Final diagnoses:  Dizziness   Discharge Instructions   None    ED Prescriptions     Medication Sig Dispense Auth. Provider   meclizine (ANTIVERT) 25 MG tablet Take 1 tablet (25 mg total) by mouth 3 (three) times daily as needed for dizziness. 30 tablet Particia Nearing, [place]      PDMP not reviewed this encounter.   Particia Nearing, [place] 06/22/21 2237

## 2021-07-10 IMAGING — CR DG SHOULDER 2+V*L*
3 series · 3 of 3 positions shown · non-contrast
Comparison: None.

CLINICAL DATA: Fall.  Rule out fracture.

EXAM:
LEFT SHOULDER - 2+ VIEW

[w shoulder axillary left *]
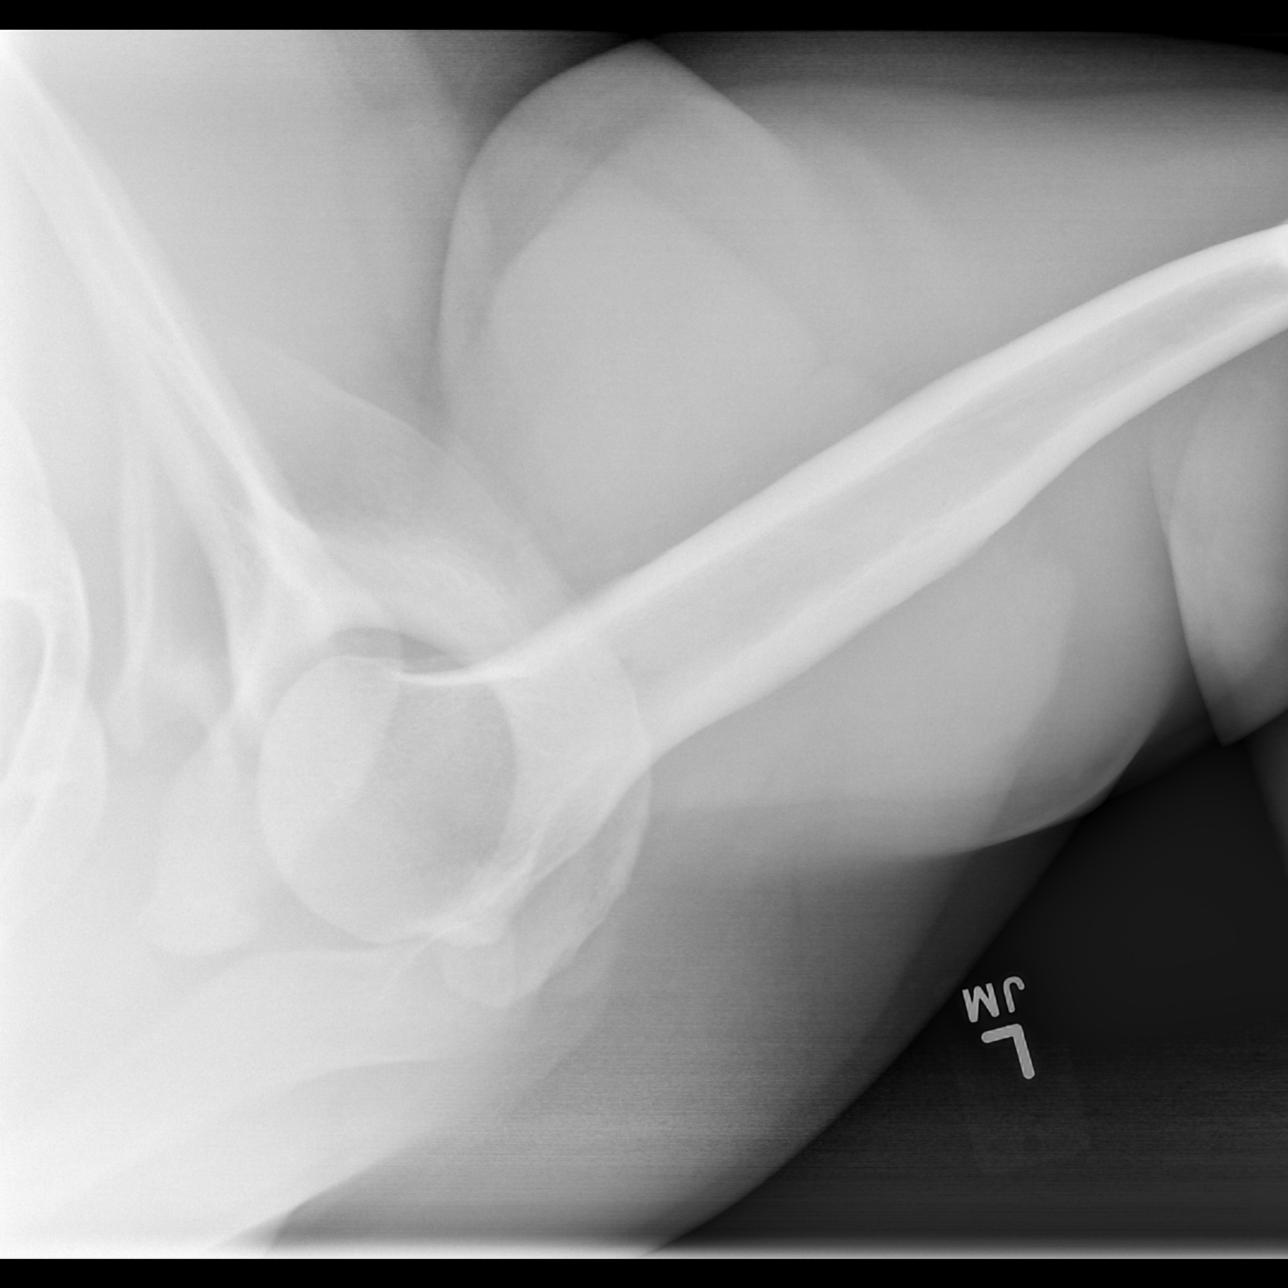

[w shoulder grashey left]
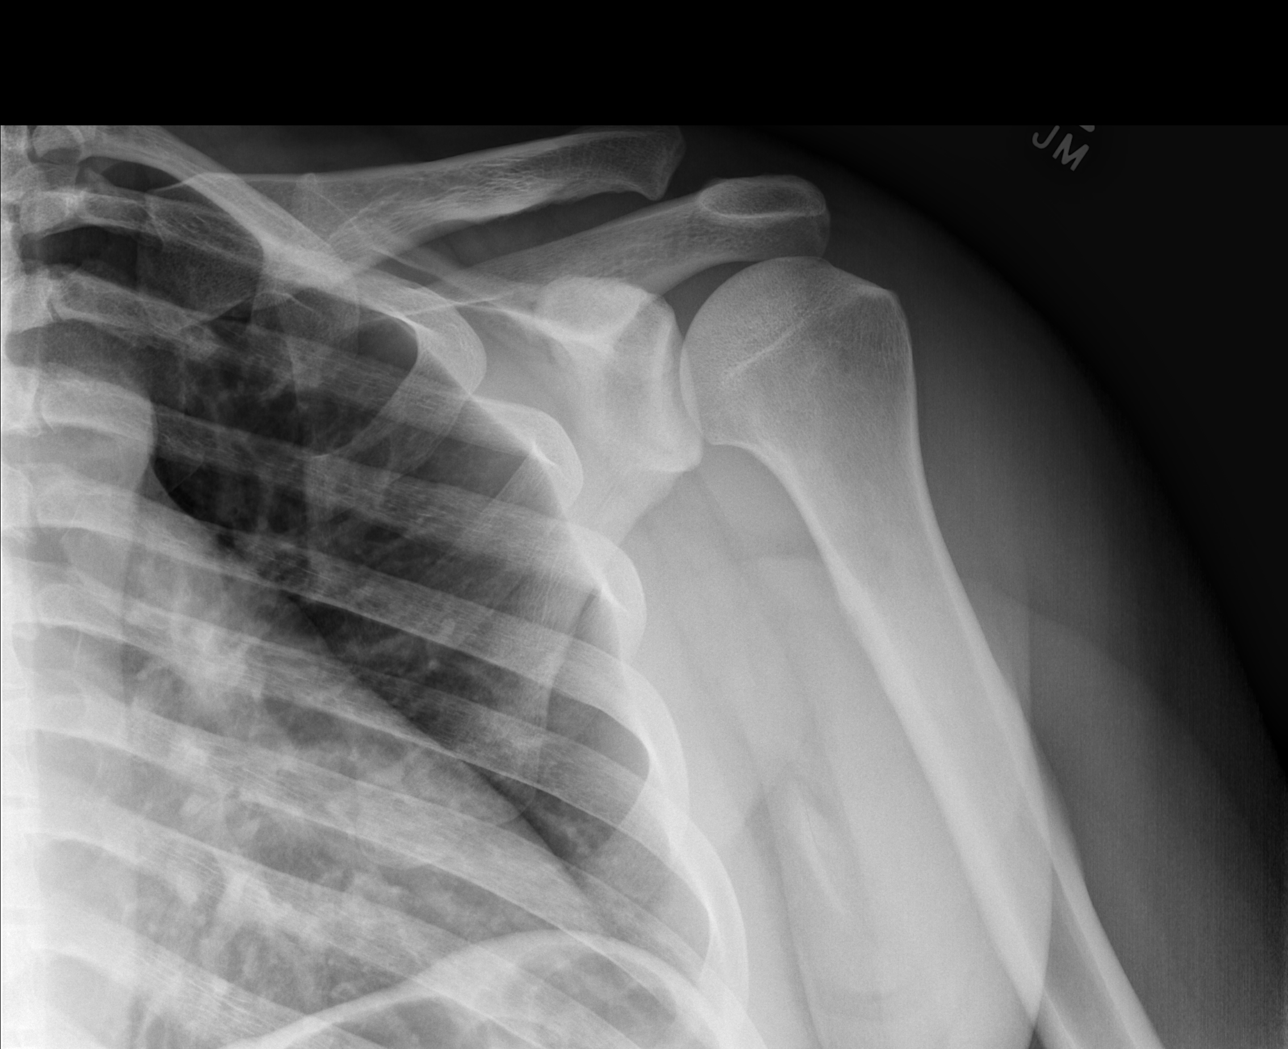

[w shoulder y view left *]
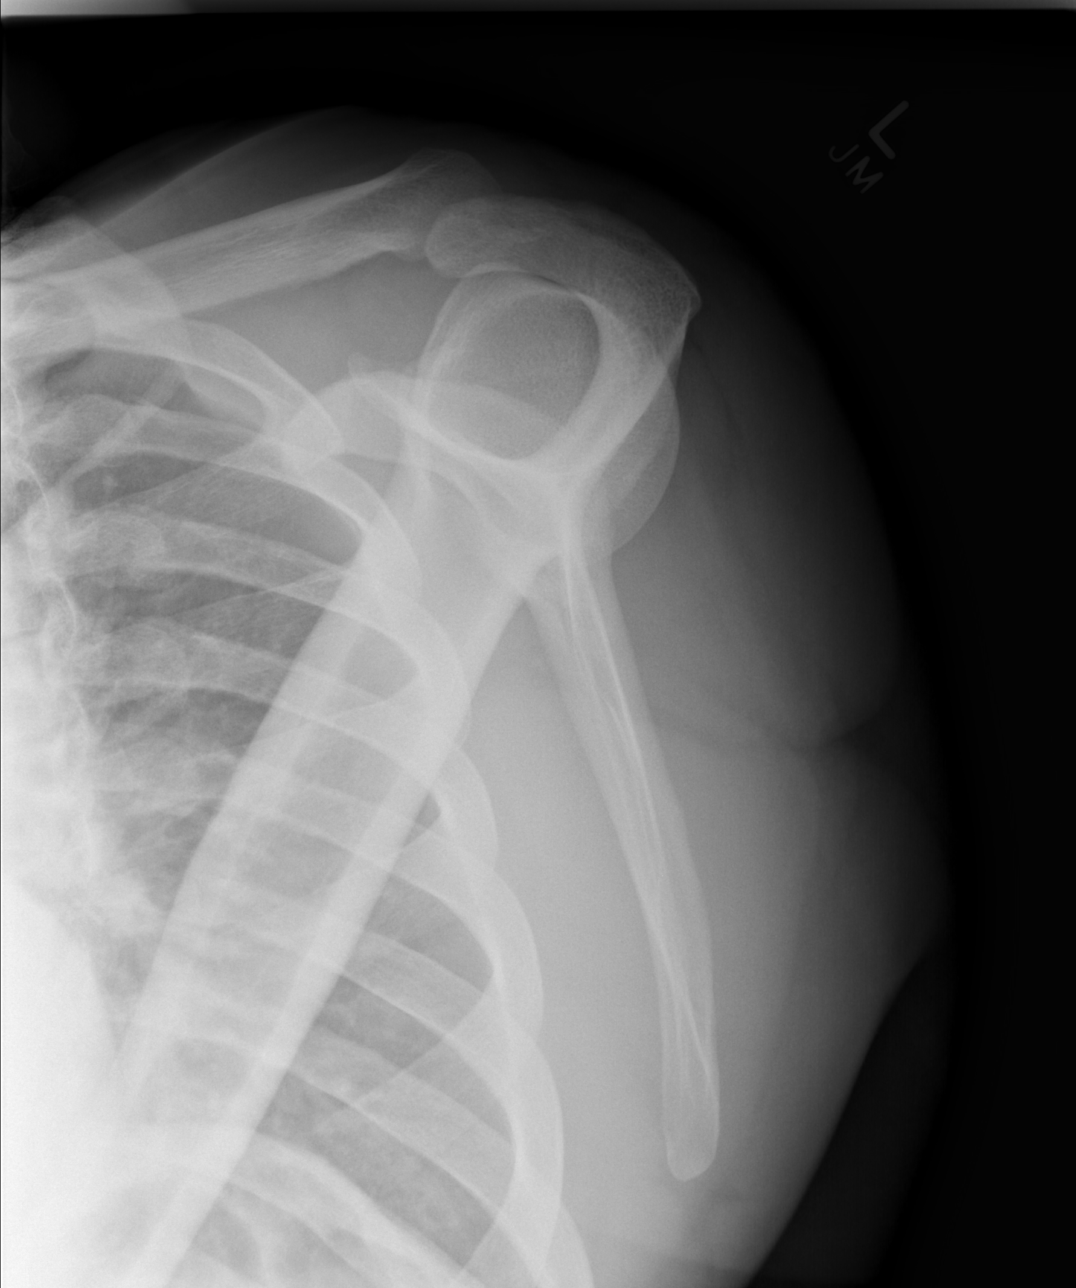

[3 of 3 positions shown; findings below may reference images not displayed]

FINDINGS: Osseous alignment is normal. No fracture line or displaced fracture
fragment is seen. Soft tissues about the LEFT shoulder are
unremarkable.
IMPRESSION: Negative.

## 2021-11-07 ENCOUNTER — Other Ambulatory Visit: Payer: Self-pay

## 2021-11-07 ENCOUNTER — Encounter: Payer: Self-pay | Admitting: Emergency Medicine

## 2021-11-07 ENCOUNTER — Ambulatory Visit
Admission: EM | Admit: 2021-11-07 | Discharge: 2021-11-07 | Disposition: A | Discharge status: Home / Self Care | Payer: Medicaid Other | Attending: Physician Assistant | Admitting: Physician Assistant

## 2021-11-07 DIAGNOSIS — J029 Acute pharyngitis, unspecified: Secondary | ICD-10-CM | POA: Insufficient documentation

## 2021-11-07 LAB — POCT RAPID STREP A (OFFICE): Rapid Strep A Screen: NEGATIVE

## 2021-11-07 MED ORDER — PREDNISONE 20 MG PO TABS: 40.0000 mg | ORAL_TABLET | Freq: Every day | ORAL | 0 refills | Status: AC

## 2021-11-07 NOTE — ED Triage Notes (Signed)
Sore throat x 5 days treating with OTC meds.

## 2021-11-07 NOTE — ED Provider Notes (Signed)
[facility]    CSN: [id number] Arrival date & time: 11/07/21  1717      History   Chief Complaint Chief Complaint  Patient presents with   Sore Throat    HPI Mitchell Murphy is a 25 y.o. male.   Patient here today for evaluation of sore throat he has had for the last 5 days.  He reports that he feels as if sore throat is worsening with time.  He has also had some nasal congestion and drainage.  He denies significant cough.  He does not report any nausea, vomiting or diarrhea.  He denies body aches.  He has tried symptomatic treatment with warm tea etc. without significant relief.  The history is provided by the patient.  Sore Throat Pertinent negatives include no abdominal pain and no shortness of breath.   Past Medical History:  Diagnosis Date   ADHD (attention deficit hyperactivity disorder)    Depression    Sickle cell trait (HCC)     There are no problems to display for this patient.   History reviewed. No pertinent surgical history.     Home Medications    Prior to Admission medications   Medication Sig Start Date End Date Taking? Authorizing Provider  predniSONE (DELTASONE) 20 MG tablet Take 2 tablets (40 mg total) by mouth daily with breakfast for 5 days. 11/07/21 11/12/21 Yes Tomi Bamberger, PA-C  albuterol (PROVENTIL HFA;VENTOLIN HFA) 108 (90 BASE) MCG/ACT inhaler Inhale 2 puffs into the lungs every 6 (six) hours as needed. For shortness of breath or wheezing    [provider]  meclizine (ANTIVERT) 25 MG tablet Take 1 tablet (25 mg total) by mouth 3 (three) times daily as needed for dizziness. 06/18/21   Particia Nearing, PA-C    Family History Family History  Problem Relation Age of Onset   Sickle cell trait Father     Social History Social History   Tobacco Use   Smoking status: Never   Smokeless tobacco: Never  Substance Use Topics   Alcohol use: No   Drug use: No     Allergies   Patient has no known  allergies.   Review of Systems Review of Systems  Constitutional:  Negative for chills and fever.  HENT:  Positive for congestion and sore throat. Negative for ear pain.   Eyes:  Negative for discharge and redness.  Respiratory:  Negative for cough and shortness of breath.   Gastrointestinal:  Negative for abdominal pain, nausea and vomiting.    Physical Exam Triage Vital Signs ED Triage Vitals  Enc Vitals Group     BP 11/07/21 1743 129/76     Pulse Rate 11/07/21 1743 66     Resp 11/07/21 1743 16     Temp 11/07/21 1743 97.9 F (36.6 C)     Temp Source 11/07/21 1743 Oral     SpO2 11/07/21 1743 97 %     Weight --      Height --      Head Circumference --      Peak Flow --      Pain Score 11/07/21 1744 10     Pain Loc --      Pain Edu? --      Excl. in GC? --    No data found.  Updated Vital Signs BP 129/76 (BP Location: Left Arm)    Pulse 66    Temp 97.9 F (36.6 C) (Oral)    Resp 16  SpO2 97%   Physical Exam Vitals and nursing note reviewed.  Constitutional:      General: He is not in acute distress.    Appearance: Normal appearance. He is not ill-appearing.  HENT:     Head: Normocephalic and atraumatic.     Nose: Nose normal. No congestion.     Mouth/Throat:     Mouth: Mucous membranes are moist.     Pharynx: Posterior oropharyngeal erythema present. No oropharyngeal exudate.  Eyes:     Conjunctiva/sclera: Conjunctivae normal.  Cardiovascular:     Rate and Rhythm: Normal rate and regular rhythm.     Heart sounds: Normal heart sounds. No murmur heard. Pulmonary:     Effort: Pulmonary effort is normal. No respiratory distress.     Breath sounds: Normal breath sounds. No wheezing, rhonchi or rales.  Skin:    General: Skin is warm and dry.  Neurological:     Mental Status: He is alert.  Psychiatric:        Mood and Affect: Mood normal.        Thought Content: Thought content normal.     UC Treatments / Results  Labs (all labs ordered are listed, but  only abnormal results are displayed) Labs Reviewed  CULTURE, GROUP A STREP [facility])  POCT RAPID STREP A (OFFICE)    EKG   Radiology No results found.  Procedures Procedures (including critical care time)  Medications Ordered in UC Medications - No data to display  Initial Impression / Assessment and Plan / UC Course  I have reviewed the triage vital signs and the nursing notes.  Pertinent labs & imaging results that were available during my care of the patient were reviewed by me and considered in my medical decision making (see chart for details).   Rapid strep negative in office.  Will treat with steroids in hopes to improve inflammation and hopefully decrease pain.  Throat culture ordered to rule out bacterial etiology.  Recommend follow-up with any further concerns or if symptoms fail to improve with treatment.  Final Clinical Impressions(s) / UC Diagnoses   Final diagnoses:  Acute pharyngitis, unspecified etiology   Discharge Instructions   None    ED Prescriptions     Medication Sig Dispense Auth. Provider   predniSONE (DELTASONE) 20 MG tablet Take 2 tablets (40 mg total) by mouth daily with breakfast for 5 days. 10 tablet Tomi Bamberger, PA-C      PDMP not reviewed this encounter.   Tomi Bamberger, PA-C 11/07/21 [phone]

## 2021-11-10 LAB — CULTURE, GROUP A STREP (THRC)

## 2022-01-02 ENCOUNTER — Encounter (HOSPITAL_COMMUNITY): Payer: Self-pay | Admitting: *Deleted

## 2022-01-02 ENCOUNTER — Emergency Department (HOSPITAL_COMMUNITY)
Admission: EM | Admit: 2022-01-02 | Discharge: 2022-01-03 | Discharge status: Against Medical Advice | Payer: Medicaid Other | Attending: Emergency Medicine | Admitting: Emergency Medicine

## 2022-01-02 ENCOUNTER — Other Ambulatory Visit: Payer: Self-pay

## 2022-01-02 DIAGNOSIS — L02412 Cutaneous abscess of left axilla: Secondary | ICD-10-CM | POA: Insufficient documentation

## 2022-01-02 DIAGNOSIS — Z20822 Contact with and (suspected) exposure to covid-19: Secondary | ICD-10-CM | POA: Insufficient documentation

## 2022-01-02 DIAGNOSIS — Z5321 Procedure and treatment not carried out due to patient leaving prior to being seen by health care provider: Secondary | ICD-10-CM | POA: Insufficient documentation

## 2022-01-02 LAB — CBC WITH DIFFERENTIAL/PLATELET
Abs Immature Granulocytes: 0.02 10*3/uL (ref 0.00–0.07)
Basophils Absolute: 0 10*3/uL (ref 0.0–0.1)
Basophils Relative: 0 %
Eosinophils Absolute: 0.2 10*3/uL (ref 0.0–0.5)
Eosinophils Relative: 2 %
HCT: 42.8 % (ref 39.0–52.0)
Hemoglobin: 14.2 g/dL (ref 13.0–17.0)
Immature Granulocytes: 0 %
Lymphocytes Relative: 18 %
Lymphs Abs: 2 10*3/uL (ref 0.7–4.0)
MCH: 26.1 pg (ref 26.0–34.0)
MCHC: 33.2 g/dL (ref 30.0–36.0)
MCV: 78.5 fL — ABNORMAL LOW (ref 80.0–100.0)
Monocytes Absolute: 0.9 10*3/uL (ref 0.1–1.0)
Monocytes Relative: 8 %
Neutro Abs: 7.7 10*3/uL (ref 1.7–7.7)
Neutrophils Relative %: 72 %
Platelets: 293 10*3/uL (ref 150–400)
RBC: 5.45 MIL/uL (ref 4.22–5.81)
RDW: 13.9 % (ref 11.5–15.5)
WBC: 10.8 10*3/uL — ABNORMAL HIGH (ref 4.0–10.5)
nRBC: 0 % (ref 0.0–0.2)

## 2022-01-02 LAB — RESP PANEL BY RT-PCR (FLU A&B, COVID) ARPGX2
Influenza A by PCR: NEGATIVE
Influenza B by PCR: NEGATIVE
SARS Coronavirus 2 by RT PCR: NEGATIVE

## 2022-01-02 NOTE — ED Triage Notes (Signed)
Pt states he has had a bump in the left armpit area for a while, today, the area became larger, more tender, and draining, reports temp 99.5.   (Large area of redness to the inner left upper arm. )

## 2022-01-02 NOTE — ED Provider Triage Note (Signed)
Emergency Medicine Provider Triage Evaluation Note  Mitchell Murphy , a 26 y.o. male  was evaluated in triage.  Pt complains of abscess to left armpit.  The patient states that he has had an area of irritation under his left armpit for nearly a year however today he was at the gym working out when it became acutely inflamed and began draining.  Patient also endorsing upper respiratory infection/flulike symptoms for unknown amount of time.  Review of Systems  Positive: Abscess to left axilla, body aches, chills Negative: Nausea, vomiting, diarrhea, abdominal pain  Physical Exam  BP (!) 135/109 (BP Location: Right Arm)    Pulse 79    Temp 99.3 F (37.4 C) (Oral)    Resp 18    SpO2 97%  Gen:   Awake, no distress   Resp:  Normal effort  MSK:   Moves extremities without difficulty  Other:  Patient has 2 cm fluctuant area to left axillary area  Medical Decision Making  Medically screening exam initiated at 9:11 PM.  Appropriate orders placed.  Mitchell Murphy was informed that the remainder of the evaluation will be completed by another provider, this initial triage assessment does not replace that evaluation, and the importance of remaining in the ED until their evaluation is complete.     Al Decant, PA-C 01/02/22 2112

## 2022-01-03 ENCOUNTER — Other Ambulatory Visit: Payer: Self-pay

## 2022-01-03 ENCOUNTER — Encounter: Payer: Self-pay | Admitting: Emergency Medicine

## 2022-01-03 ENCOUNTER — Ambulatory Visit
Admission: EM | Admit: 2022-01-03 | Discharge: 2022-01-03 | Disposition: A | Discharge status: Home / Self Care | Payer: Medicaid Other | Attending: Physician Assistant | Admitting: Physician Assistant

## 2022-01-03 DIAGNOSIS — L0291 Cutaneous abscess, unspecified: Secondary | ICD-10-CM

## 2022-01-03 MED ORDER — DOXYCYCLINE HYCLATE 100 MG PO CAPS: 100.0000 mg | ORAL_CAPSULE | Freq: Two times a day (BID) | ORAL | 0 refills | Status: AC

## 2022-01-03 NOTE — ED Notes (Signed)
PT DECIDED TO LEAVE 

## 2022-01-03 NOTE — ED Provider Notes (Signed)
[facility]    CSN: [id number] Arrival date & time: 01/03/22  1556      History   Chief Complaint Chief Complaint  Patient presents with   Abscess    HPI Mitchell Murphy is a 26 y.o. male.   Patient here today for evaluation of possible abscess to his left axillary area.  He states that he first noticed this 2 days ago but pain and warmth have worsened since that time.  He states he also thinks he might of had a fever.  He notes he has had some minimal purulent drainage, and states that drainage did have foul smell.  He has tried topical antibiotic ointment without significant relief.  The history is provided by the patient.  Abscess Associated symptoms: fever    Past Medical History:  Diagnosis Date   Depression    Sickle cell trait (HCC)     There are no problems to display for this patient.   History reviewed. No pertinent surgical history.     Home Medications    Prior to Admission medications   Medication Sig Start Date End Date Taking? Authorizing Provider  doxycycline (VIBRAMYCIN) 100 MG capsule Take 1 capsule (100 mg total) by mouth 2 (two) times daily. 01/03/22  Yes Tomi Bamberger, PA-C  albuterol (PROVENTIL HFA;VENTOLIN HFA) 108 (90 BASE) MCG/ACT inhaler Inhale 2 puffs into the lungs every 6 (six) hours as needed for shortness of breath.    [provider]  meclizine (ANTIVERT) 25 MG tablet Take 1 tablet (25 mg total) by mouth 3 (three) times daily as needed for dizziness. Patient not taking: Reported on 01/02/2022 06/18/21   Particia Nearing, PA-C  naproxen (NAPROSYN) 250 MG tablet Take 250 mg by mouth daily as needed.    [provider]  omega-3 acid ethyl esters (LOVAZA) 1 g capsule Take 1 g by mouth daily.    [provider]  VITAMIN D PO Take 1 tablet by mouth daily.    [provider]    Family History Family History  Problem Relation Age of Onset   Sickle cell trait Father     Social  History Social History   Tobacco Use   Smoking status: Never   Smokeless tobacco: Never  Substance Use Topics   Alcohol use: No   Drug use: No     Allergies   Patient has no known allergies.   Review of Systems Review of Systems  Constitutional:  Positive for fever.  Eyes:  Negative for discharge and redness.  Skin:  Positive for color change. Negative for wound.    Physical Exam Triage Vital Signs ED Triage Vitals [01/03/22 1837]  Enc Vitals Group     BP (!) 114/57     Pulse Rate 74     Resp 16     Temp 98.9 F (37.2 C)     Temp Source Oral     SpO2 98 %     Weight      Height      Head Circumference      Peak Flow      Pain Score 10     Pain Loc      Pain Edu?      Excl. in GC?    No data found.  Updated Vital Signs BP (!) 114/57 (BP Location: Right Arm)    Pulse 74    Temp 98.9 F (37.2 C) (Oral)    Resp 16    SpO2  98%   Physical Exam Vitals and nursing note reviewed.  Constitutional:      General: He is not in acute distress.    Appearance: Normal appearance. He is not ill-appearing.  HENT:     Head: Normocephalic and atraumatic.  Eyes:     Conjunctiva/sclera: Conjunctivae normal.  Cardiovascular:     Rate and Rhythm: Normal rate.  Pulmonary:     Effort: Pulmonary effort is normal.  Skin:    Comments: Approximately 4 cm area of induration noted to left upper arm at axillary with diffuse erythema, and tenderness to palpation.  Neurological:     Mental Status: He is alert.  Psychiatric:        Mood and Affect: Mood normal.        Behavior: Behavior normal.        Thought Content: Thought content normal.     UC Treatments / Results  Labs (all labs ordered are listed, but only abnormal results are displayed) Labs Reviewed - No data to display  EKG   Radiology No results found.  Procedures Incision and Drainage  Date/Time: 01/03/2022 8:36 PM Performed by: Tomi Bamberger, PA-C Authorized by: Tomi Bamberger, PA-C   Consent:     Consent obtained:  Verbal   Consent given by:  Patient   Risks, benefits, and alternatives were discussed: yes     Risks discussed:  Bleeding, incomplete drainage and pain   Alternatives discussed:  No treatment and alternative treatment Universal protocol:    Procedure explained and questions answered to patient or proxy's satisfaction: yes     Relevant documents present and verified: yes     Required blood products, implants, devices, and special equipment available: yes     Site/side marked: yes     Patient identity confirmed:  Provided demographic data Location:    Type:  Abscess Pre-procedure details:    Skin preparation:  Betadine Sedation:    Sedation type:  None Anesthesia:    Anesthesia method:  Local infiltration   Local anesthetic:  Lidocaine 1% w/o epi (3 cc) Procedure type:    Complexity:  Simple Procedure details:    Ultrasound guidance: no     Incision types:  Cruciate   Incision depth:  Subcutaneous   Drainage:  Purulent   Drainage amount:  Moderate   Wound treatment:  Wound left open   Packing materials:  None Post-procedure details:    Procedure completion:  Tolerated well, no immediate complications (including critical care time)  Medications Ordered in UC Medications - No data to display  Initial Impression / Assessment and Plan / UC Course  I have reviewed the triage vital signs and the nursing notes.  Pertinent labs & imaging results that were available during my care of the patient were reviewed by me and considered in my medical decision making (see chart for details).    Abscess successfully drained in office.  Recommended antibiotic therapy, and encouraged to follow-up if symptoms fail to improve with time or worsen.  Patient expresses understanding.  Final Clinical Impressions(s) / UC Diagnoses   Final diagnoses:  Abscess   Discharge Instructions   None    ED Prescriptions     Medication Sig Dispense Auth. Provider   doxycycline  (VIBRAMYCIN) 100 MG capsule Take 1 capsule (100 mg total) by mouth 2 (two) times daily. 20 capsule Tomi Bamberger, PA-C      PDMP not reviewed this encounter.   Tomi Bamberger, PA-C 01/03/22 2037

## 2022-01-03 NOTE — ED Triage Notes (Signed)
Went to ED for potential abscess last night and the wait was 12 hours, so he LWBS. Patient believed there was an ingrown hair in his left axillary area 2 days ago, yesterday developed a fever which led him to the ED.

## 2023-03-10 ENCOUNTER — Encounter: Payer: Self-pay | Admitting: *Deleted
